# Patient Record
Sex: Male | Born: 1990 | Race: Black or African American | Hispanic: No | Smoking: Never smoker
Health system: Southern US, Community
[De-identification: ages and names within clinical notes are randomized; demographics above are authoritative.]

---

## 2013-06-30 ENCOUNTER — Inpatient Hospital Stay (HOSPITAL_COMMUNITY)
Admission: EM | Admit: 2013-06-30 | Discharge: 2013-07-03 | DRG: 605 | Disposition: A | Discharge status: Other Acute Inpatient Hospital | Payer: Federal, State, Local not specified - Other | Attending: General Surgery | Admitting: General Surgery

## 2013-06-30 ENCOUNTER — Emergency Department (HOSPITAL_COMMUNITY): Payer: Self-pay

## 2013-06-30 ENCOUNTER — Encounter (HOSPITAL_COMMUNITY): Payer: Self-pay | Admitting: Emergency Medicine

## 2013-06-30 DIAGNOSIS — F322 Major depressive disorder, single episode, severe without psychotic features: Secondary | ICD-10-CM | POA: Diagnosis present

## 2013-06-30 DIAGNOSIS — R45851 Suicidal ideations: Secondary | ICD-10-CM

## 2013-06-30 DIAGNOSIS — X789XXA Intentional self-harm by unspecified sharp object, initial encounter: Secondary | ICD-10-CM | POA: Diagnosis present

## 2013-06-30 DIAGNOSIS — S31109A Unspecified open wound of abdominal wall, unspecified quadrant without penetration into peritoneal cavity, initial encounter: Principal | ICD-10-CM | POA: Diagnosis present

## 2013-06-30 DIAGNOSIS — S31119A Laceration without foreign body of abdominal wall, unspecified quadrant without penetration into peritoneal cavity, initial encounter: Secondary | ICD-10-CM

## 2013-06-30 DIAGNOSIS — F411 Generalized anxiety disorder: Secondary | ICD-10-CM | POA: Diagnosis present

## 2013-06-30 DIAGNOSIS — Y92009 Unspecified place in unspecified non-institutional (private) residence as the place of occurrence of the external cause: Secondary | ICD-10-CM

## 2013-06-30 DIAGNOSIS — F332 Major depressive disorder, recurrent severe without psychotic features: Secondary | ICD-10-CM | POA: Diagnosis present

## 2013-06-30 LAB — CBC
HCT: 41 % (ref 39.0–52.0)
Hemoglobin: 14.4 g/dL (ref 13.0–17.0)
MCH: 31.9 pg (ref 26.0–34.0)
MCHC: 35.1 g/dL (ref 30.0–36.0)
MCV: 90.9 fL (ref 78.0–100.0)
Platelets: 194 10*3/uL (ref 150–400)
RBC: 4.51 MIL/uL (ref 4.22–5.81)
RDW: 13 % (ref 11.5–15.5)
WBC: 5.3 10*3/uL (ref 4.0–10.5)

## 2013-06-30 LAB — COMPREHENSIVE METABOLIC PANEL
ALK PHOS: 63 U/L (ref 39–117)
ALT: 9 U/L (ref 0–53)
AST: 19 U/L (ref 0–37)
Albumin: 4.5 g/dL (ref 3.5–5.2)
BUN: 12 mg/dL (ref 6–23)
CO2: 26 mEq/L (ref 19–32)
Calcium: 9.8 mg/dL (ref 8.4–10.5)
Chloride: 104 mEq/L (ref 96–112)
Creatinine, Ser: 1.13 mg/dL (ref 0.50–1.35)
GFR calc Af Amer: >90 mL/min (ref >90)
GFR calc non Af Amer: >90 mL/min (ref >90)
Glucose, Bld: 108 mg/dL — ABNORMAL HIGH (ref 70–99)
Potassium: 3.9 mEq/L (ref 3.7–5.3)
Sodium: 144 mEq/L (ref 137–147)
TOTAL PROTEIN: 8 g/dL (ref 6.0–8.3)
Total Bilirubin: 0.4 mg/dL (ref 0.3–1.2)

## 2013-06-30 LAB — RAPID URINE DRUG SCREEN, HOSP PERFORMED
Amphetamines: NOT DETECTED
BARBITURATES: NOT DETECTED
Benzodiazepines: NOT DETECTED
Cocaine: NOT DETECTED
Opiates: NOT DETECTED
Tetrahydrocannabinol: NOT DETECTED

## 2013-06-30 LAB — I-STAT CHEM 8, ED
BUN: 13 mg/dL (ref 6–23)
CHLORIDE: 104 meq/L (ref 96–112)
Calcium, Ion: 1.24 mmol/L — ABNORMAL HIGH (ref 1.12–1.23)
Creatinine, Ser: 1.2 mg/dL (ref 0.50–1.35)
Glucose, Bld: 105 mg/dL — ABNORMAL HIGH (ref 70–99)
HEMATOCRIT: 57 % — AB (ref 39.0–52.0)
Hemoglobin: 19.4 g/dL — ABNORMAL HIGH (ref 13.0–17.0)
Potassium: 3.8 mEq/L (ref 3.7–5.3)
Sodium: 143 mEq/L (ref 137–147)
TCO2: 26 mmol/L (ref 0–100)

## 2013-06-30 LAB — TYPE AND SCREEN
ABO/RH(D): O POS
ANTIBODY SCREEN: NEGATIVE

## 2013-06-30 LAB — ACETAMINOPHEN LEVEL: Acetaminophen (Tylenol), Serum: <15 ug/mL (ref 10–30)

## 2013-06-30 LAB — ETHANOL

## 2013-06-30 LAB — SALICYLATE LEVEL: Salicylate Lvl: <2 mg/dL — ABNORMAL LOW (ref 2.8–20.0)

## 2013-06-30 LAB — ABO/RH: ABO/RH(D): O POS

## 2013-06-30 MED ORDER — IOHEXOL 300 MG/ML  SOLN
100.0000 mL | Freq: Once | INTRAMUSCULAR | Status: AC | PRN
  Administered 2013-06-30: 100 mL via INTRAVENOUS

## 2013-06-30 MED ORDER — SODIUM CHLORIDE 0.9 % IV SOLN
INTRAVENOUS | Status: DC
  Administered 2013-06-30: 21:00:00 via INTRAVENOUS

## 2013-06-30 NOTE — ED Notes (Signed)
MD at bedside. 

## 2013-06-30 NOTE — ED Notes (Signed)
Pt
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
## 2013-06-30 NOTE — ED Notes (Signed)
Bed: RESB Expected date:  Expected time:  Means of arrival:  Comments: EMS/22 yo male SI

## 2013-06-30 NOTE — ED Notes (Signed)
Pt belongings: grey sweat pants, black wallet (no cash), and black and grey sneakers

## 2013-07-01 DIAGNOSIS — X789XXA Intentional self-harm by unspecified sharp object, initial encounter: Secondary | ICD-10-CM

## 2013-07-01 DIAGNOSIS — F332 Major depressive disorder, recurrent severe without psychotic features: Secondary | ICD-10-CM | POA: Diagnosis present

## 2013-07-01 DIAGNOSIS — F322 Major depressive disorder, single episode, severe without psychotic features: Secondary | ICD-10-CM

## 2013-07-01 DIAGNOSIS — S31109A Unspecified open wound of abdominal wall, unspecified quadrant without penetration into peritoneal cavity, initial encounter: Principal | ICD-10-CM

## 2013-07-01 LAB — CBC
HEMATOCRIT: 39.4 % (ref 39.0–52.0)
Hemoglobin: 13.7 g/dL (ref 13.0–17.0)
MCH: 31.7 pg (ref 26.0–34.0)
MCHC: 34.8 g/dL (ref 30.0–36.0)
MCV: 91.2 fL (ref 78.0–100.0)
Platelets: 185 10*3/uL (ref 150–400)
RBC: 4.32 MIL/uL (ref 4.22–5.81)
RDW: 13.2 % (ref 11.5–15.5)
WBC: 6.1 10*3/uL (ref 4.0–10.5)

## 2013-07-01 LAB — BASIC METABOLIC PANEL
BUN: 9 mg/dL (ref 6–23)
CALCIUM: 9 mg/dL (ref 8.4–10.5)
CO2: 23 mEq/L (ref 19–32)
Chloride: 106 mEq/L (ref 96–112)
Creatinine, Ser: 0.92 mg/dL (ref 0.50–1.35)
Glucose, Bld: 100 mg/dL — ABNORMAL HIGH (ref 70–99)
POTASSIUM: 3.3 meq/L — AB (ref 3.7–5.3)
Sodium: 142 mEq/L (ref 137–147)

## 2013-07-01 LAB — MRSA PCR SCREENING: MRSA by PCR: NEGATIVE

## 2013-07-01 MED ORDER — NAPROXEN 500 MG PO TABS
500.0000 mg | ORAL_TABLET | Freq: Two times a day (BID) | ORAL | Status: DC
  Administered 2013-07-01 – 2013-07-03 (×4): 500 mg via ORAL
  Filled 2013-07-01 (×6): qty 1

## 2013-07-01 MED ORDER — ONDANSETRON HCL 4 MG PO TABS
4.0000 mg | ORAL_TABLET | Freq: Four times a day (QID) | ORAL | Status: DC | PRN
Start: 2013-07-01 — End: 2013-07-03

## 2013-07-01 MED ORDER — PANTOPRAZOLE SODIUM 40 MG IV SOLR
40.0000 mg | Freq: Every day | INTRAVENOUS | Status: DC
  Filled 2013-07-01: qty 40

## 2013-07-01 MED ORDER — MORPHINE SULFATE 2 MG/ML IJ SOLN: 2.0000 mg | INTRAMUSCULAR | Status: DC | PRN

## 2013-07-01 MED ORDER — ACETAMINOPHEN 325 MG PO TABS
650.0000 mg | ORAL_TABLET | ORAL | Status: DC | PRN
  Filled 2013-07-01: qty 2

## 2013-07-01 MED ORDER — PANTOPRAZOLE SODIUM 40 MG PO TBEC: 40.0000 mg | DELAYED_RELEASE_TABLET | Freq: Every day | ORAL | Status: DC

## 2013-07-01 MED ORDER — ONDANSETRON HCL 4 MG/2ML IJ SOLN: 4.0000 mg | Freq: Four times a day (QID) | INTRAMUSCULAR | Status: DC | PRN

## 2013-07-01 MED ORDER — ENOXAPARIN SODIUM 40 MG/0.4ML ~~LOC~~ SOLN
40.0000 mg | SUBCUTANEOUS | Status: DC
  Administered 2013-07-01: 40 mg via SUBCUTANEOUS
  Filled 2013-07-01 (×4): qty 0.4

## 2013-07-01 MED ORDER — KCL IN DEXTROSE-NACL 20-5-0.9 MEQ/L-%-% IV SOLN
INTRAVENOUS | Status: DC
  Administered 2013-07-01: 01:00:00 via INTRAVENOUS
  Filled 2013-07-01 (×3): qty 1000

## 2013-07-01 MED ORDER — BACITRACIN ZINC 500 UNIT/GM EX OINT
TOPICAL_OINTMENT | Freq: Two times a day (BID) | CUTANEOUS | Status: DC
  Administered 2013-07-01 – 2013-07-02 (×3): via TOPICAL
  Administered 2013-07-02: 1 via TOPICAL
  Administered 2013-07-03: 11:00:00 via TOPICAL
  Filled 2013-07-01: qty 15
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
## 2013-07-01 NOTE — Progress Notes (Signed)
Report called to Alona BeneJoyce, receiving RN on  6 north. VSS. Transferred to [id number] via wheelchair with personal belongings. Suicide sitter transferred with patient.  [facility] Lailana Shira,RN

## 2013-07-01 NOTE — Progress Notes (Signed)
UR completed.  Zyria Fiscus, RN BSN MHA CCM Trauma/Neuro ICU Case Manager [phone]  

## 2013-07-01 NOTE — Clinical Social Work Psych Note (Signed)
Psych consult has been confirmed.  Psych CSW now following.  Vickii PennaGina Taytum Wheller, LCSWA [phone]  Clinical Social Work

## 2013-07-01 NOTE — Progress Notes (Signed)
Patient doing well.  This patient has been seen and I agree with the findings and treatment plan.  Marta LamasJames O. Gae BonWyatt, III, MD, FACS [phone] (pager) [phone] (direct pager) Trauma Surgeon
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
## 2013-07-02 NOTE — Progress Notes (Signed)
Medically stable [facility]. Patient examined and I agree with the assessment and plan  Christopher GelinasBurke [name], MD, MPH, FACS Trauma: [phone] General Surgery: [phone]  07/02/2013 1:01 PM
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
## 2013-07-02 NOTE — Clinical Social Work Psych Note (Signed)
[facility]  Vickii PennaGina Aureliano Oshields, LCSWA [phone]  Clinical Social Work

## 2013-07-03 ENCOUNTER — Encounter (HOSPITAL_COMMUNITY): Payer: Self-pay | Admitting: *Deleted

## 2013-07-03 ENCOUNTER — Inpatient Hospital Stay (HOSPITAL_COMMUNITY)
Admission: AD | Admit: 2013-07-03 | Discharge: 2013-07-08 | DRG: 885 | Disposition: A | Discharge status: Home / Self Care | Payer: Federal, State, Local not specified - Other | Source: Intra-hospital | Attending: Psychiatry | Admitting: Psychiatry

## 2013-07-03 DIAGNOSIS — R45851 Suicidal ideations: Secondary | ICD-10-CM

## 2013-07-03 DIAGNOSIS — F332 Major depressive disorder, recurrent severe without psychotic features: Principal | ICD-10-CM

## 2013-07-03 DIAGNOSIS — F411 Generalized anxiety disorder: Secondary | ICD-10-CM | POA: Diagnosis present

## 2013-07-03 DIAGNOSIS — Z634 Disappearance and death of family member: Secondary | ICD-10-CM

## 2013-07-03 DIAGNOSIS — S31119A Laceration without foreign body of abdominal wall, unspecified quadrant without penetration into peritoneal cavity, initial encounter: Secondary | ICD-10-CM | POA: Diagnosis present

## 2013-07-03 DIAGNOSIS — G4700 Insomnia, unspecified: Secondary | ICD-10-CM | POA: Diagnosis present

## 2013-07-03 DIAGNOSIS — E876 Hypokalemia: Secondary | ICD-10-CM | POA: Diagnosis present

## 2013-07-03 DIAGNOSIS — F39 Unspecified mood [affective] disorder: Secondary | ICD-10-CM | POA: Diagnosis present

## 2013-07-03 DIAGNOSIS — F4321 Adjustment disorder with depressed mood: Secondary | ICD-10-CM

## 2013-07-03 MED ORDER — TRAZODONE HCL 50 MG PO TABS
50.0000 mg | ORAL_TABLET | Freq: Every evening | ORAL | Status: DC | PRN
  Administered 2013-07-03 – 2013-07-07 (×6): 50 mg via ORAL
  Filled 2013-07-03 (×14): qty 1

## 2013-07-03 MED ORDER — ACETAMINOPHEN 325 MG PO TABS: 650.0000 mg | ORAL_TABLET | Freq: Four times a day (QID) | ORAL | Status: DC | PRN

## 2013-07-03 MED ORDER — MAGNESIUM HYDROXIDE 400 MG/5ML PO SUSP: 30.0000 mL | Freq: Every day | ORAL | Status: DC | PRN

## 2013-07-03 MED ORDER — ALUM & MAG HYDROXIDE-SIMETH 200-200-20 MG/5ML PO SUSP: 30.0000 mL | ORAL | Status: DC | PRN

## 2013-07-03 MED ORDER — HYDROXYZINE HCL 25 MG PO TABS: 25.0000 mg | ORAL_TABLET | Freq: Four times a day (QID) | ORAL | Status: DC | PRN

## 2013-07-03 MED ORDER — POTASSIUM CHLORIDE CRYS ER 20 MEQ PO TBCR
20.0000 meq | EXTENDED_RELEASE_TABLET | Freq: Two times a day (BID) | ORAL | Status: AC
  Administered 2013-07-03 – 2013-07-05 (×4): 20 meq via ORAL
  Filled 2013-07-03 (×5): qty 1
  Filled 2013-07-03: qty 2

## 2013-07-03 NOTE — Discharge Instructions (Signed)
Wash wounds daily in shower with soap and water. Do not soak. Apply antibiotic ointment (e.g. Neosporin) twice daily and as needed to keep moist. Cover with dry dressing.  

## 2013-07-03 NOTE — Progress Notes (Signed)
[facility] Group Notes:  (Nursing/MHT/Case Management/Adjunct)  Date:  07/03/2013  Time:  8:00 p.m.   Type of Therapy:  Psychoeducational Skills  Participation Level:  None  Participation Quality:  Resistant  Affect:  Depressed  Cognitive:  Lacking  Insight:  None  Engagement in Group:  None  Modes of Intervention:  Education  Summary of Progress/Problems: The patient attended group this evening, but refused to share.   Hazle CocaGOODMAN, Loula Marcella S 07/03/2013, 10:39 PM

## 2013-07-03 NOTE — Progress Notes (Signed)
Bed available at [facility] today per [facility]. CSW met with patient to discuss and he agreed to sign voluntary paperwork. His girlfriend, who was present per his permission, supported his going over to [facility].  [facility] [employer] notified and will come and pick patient up.  Voluntary consent form sent to [facility] for record.  Patient's nurse Blanch Media notified and will complete d/c.  Patient was noted to be calm and cooperative- he has been a resident of [facility] in the past.  CSW provided support and encouragement; answered questions and concerns posed by patient and his girlfriend. CSW signing off.  Lorie Phenix. [place], [place]
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
## 2013-07-03 NOTE — Progress Notes (Signed)
Patient examined and I agree with the assessment and plan  Violeta GelinasBurke Naysha Sholl, MD, MPH, FACS Trauma: [phone] General Surgery: [phone]  07/03/2013 10:50 AM

## 2013-07-03 NOTE — Progress Notes (Signed)
[facility]

## 2013-07-04 DIAGNOSIS — F329 Major depressive disorder, single episode, unspecified: Secondary | ICD-10-CM

## 2013-07-04 DIAGNOSIS — Z634 Disappearance and death of family member: Secondary | ICD-10-CM

## 2013-07-04 DIAGNOSIS — F4321 Adjustment disorder with depressed mood: Secondary | ICD-10-CM | POA: Diagnosis present

## 2013-07-04 NOTE — [facility] Suicide Risk Assessment (Signed)
[facility] INPATIENT:  Family/Significant Other Suicide Prevention Education  Suicide Prevention Education:  Family/Significant Other Refusal to Support Patient after Discharge:  Suicide Prevention Education Not Provided:  Patient has identified home of family/significant other as the place the patient will be residing after discharge.  With written consent of the patient, two attempts were made to provide Suicide Prevention Education to [name], [phone];  This person indicates he/she will not be responsible for the patient after discharge. Ms. Christopher [name] advised due to her history of suicide attempts, she dos not want to receive the informtion.  Christopher [name] 07/04/2013,4:11 PM

## 2013-07-04 NOTE — Progress Notes (Signed)
The focus of this group is to educate the patient on the purpose and policies of crisis stabilization and provide a format to answer questions about their admission.  The group details unit policies and expectations of patients while admitted.  Patient attended 0900 nurse education orientation group this morning.  Patient actively participated, appropriate affect, alert, appropriate insight and engagement.  Today patient will work on 3 goals for discharge.  

## 2013-07-04 NOTE — Progress Notes (Signed)
Adult Psychoeducational Group Note  Date:  07/04/2013 Time:  10:18 AM  Group Topic/Focus:  Self Esteem Action Plan:   The focus of this group is to help patients create a plan to continue to build self-esteem after discharge.  Participation Level:  Did Not Attend   Elijio MilesMercer, Lucian Baswell N 07/04/2013, 10:18 AM
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
## 2013-07-04 NOTE — Progress Notes (Signed)
D:  Patient's self inventory sheet, patient sleeps well, improving appetite, normal energy level, good attention span.  Rated depression and anxiety 5, hopeless 1.  Denied withdrawals.  Denied SI.  Denied physical problems.  Plans to see counselor after discharge.  Does have discharge plans.  Not concerned about medications. A:  Medications administered per MD orders.  Emotional support and encouragement given patient. R:  Denied SI and HI.  Denied A/V hallucinations.  Will continue to monitor patient for safety with 15 minute checks.  Safety maintained.

## 2013-07-04 NOTE — Progress Notes (Signed)
Patient ID: Christopher [name], male   DOB: 10/17/1990, 23 y.o.   MRN: [id number] D: Pt. Visible on the unit, reports depression as "2" of 10, notes that he has considered conversation between himself and physician and he does want to be treated with an antidepressant. A: Writer introduced self to client and encourages him to speak to physician in the morning about medications. Staff encouraged group. Staff will monitor q6415min for safety. R: Pt. Is safe on the unit and attended karaoke.

## 2013-07-04 NOTE — BHH Counselor (Signed)
Adult Comprehensive Assessment  Patient ID: Lamone Ferrelli, male   DOB: [birth year], 23 y.o.   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 Family Relationships: Stress in relationship with fiance since death of thier daughter from [employer] / Lack of resources (include bankruptcy): Needs more money but makes it okay Housing / Lack of housing: None Physical health (include injuries & life threatening diseases): None Social relationships: None Substance abuse: None  Bereavement / Loss: One month old daughter died of SIDS 03-04-2013  Living/Environment/Situation:  Living conditions (as described by patient or guardian): Okay How long has patient lived in current situation?: Twoo months What is atmosphere in current home: Comfortable;Supportive  Family History:  Marital status: Long term relationship Long term relationship, how long?: One year  What types of issues is patient dealing with in the relationship?: Death of infant daughter and relocating to [place]  Childhood History:  By whom was/is the patient raised?: Grandparents Additional childhood history information: Patient reports his mother diied when he was [age] years old, Grandmother at age 43 and a sister died when he was [age]. Description of patient's relationship with caregiver when they were a child: Good relationship Patient's description of current relationship with people who raised him/her: Good relationship with Grandmother who he refers to as his mother Does patient have siblings?: Yes Number of Siblings: 1 Description of patient's current relationship with siblings: Very close - patient reports having other sibling by his father but not having a relationship with them Did patient suffer any  verbal/emotional/physical/sexual abuse as a child?: No Did patient suffer from severe childhood neglect?: No Has patient ever been sexually abused/assaulted/raped as an adolescent or adult?: No Was the patient ever a victim of a crime or a disaster?: No Witnessed domestic violence?: No Has patient been effected by domestic violence as an adult?: No  Education:  Highest grade of school patient has completed: Producer, television/film/video Currently a student?: No Learning disability?: No  Employment/Work Situation:   Employment situation: Employed Where is patient currently employed?: Patient reports bieng employed by [employer] and [employer] How long has patient been employed?: [employer] for four months and [employer] for one month Patient's job has been impacted by current illness: No What is the longest time patient has a held a job?: Three and a half Years Where was the patient employed at that time?: [employer] Has patient ever been in the [employer]?: Yes (Describe in comment) Garment/textile technologist) Has patient ever served in Buyer, retail?: No  Financial Resources:   Surveyor, quantity resources: Income from employment  Alcohol/Substance Abuse:   What has been your use of drugs/alcohol within the last 12 months?: Patient denies If attempted suicide, did drugs/alcohol play a role in this?: No Alcohol/Substance Abuse Treatment Hx: Denies past history Has alcohol/substance abuse ever caused legal problems?: No  Social Support System:   Forensic psychologist System: None Describe Community Support System: N/A Type of faith/religion: Christian How does patient's faith help to cope with current illness?: Patient reports he has questioned God since the death of his daughter  Leisure/Recreation:   Leisure and Hobbies: Counselling psychologist, walking and traveling  Strengths/Needs:   What things does the patient do well?: Paitent reports he is a very giving person In what areas does patient struggle / problems for patient: Anger  Discharge Plan:  Does patient have access to transportation?: Yes Will patient be returning to same living situation after discharge?: Yes Currently receiving community mental health services: No If no, would patient like referral for services when discharged?:  ([facility]) Does patient have financial barriers related to discharge medications?: Yes Patient description of barriers related to discharge medications: Patient does not have insurance.  Summary/Recommendations:  Braulio Conteyler Savoia is a 23 year old African American male admitted with Major Depression Disorder.  He will .benefit from crisis stabilization, evaluation for medication, psycho-education groups for coping skills development, group therapy and case management for discharge planning.=    Aleks Nawrot Hairston. 07/04/2013

## 2013-07-04 NOTE — B[facility] Group Notes (Signed)
B[facility] LCSW Group Therapy  Living A Balanced Life  1:15 - 2: 30          07/04/2013    Type of Therapy:  Group Therapy  Participation Level:  Appropriate  Participation Quality:  Appropriate  Affect:  Appropriate  Cognitive:  Attentive Appropriate  Insight: Developing/Improving  Engagement in Therapy:  Developing/Improving  Modes of Intervention:  Discussion Exploration Problem-Solving Supportive   Summary of Progress/Problems: Topic for group was Living a Balanced Life.  Patient was able to show how life has become unbalanced.  Patient shared since moving to [place] a few months ago, he spends most of his time working.  He shared he had a child to die of SIDS October 2014 and he is having difficulty dealing with that loss.   Patient able to  Identify appropriate coping skills.   Wynn BankerHodnett, Krystn Dermody Hairston 07/04/2013

## 2013-07-04 NOTE — [facility] Suicide Risk Assessment (Signed)
   Nursing information obtained from:  Patient Demographic factors:  Male;Adolescent or young adult Current Mental Status:    Loss Factors:  Loss of significant relationship Historical Factors:    Risk Reduction Factors:  Sense of responsibility to family;Religious beliefs about death;Employed;Positive social support Total Time spent with patient: 45 minutes  CLINICAL FACTORS:   Severe Anxiety and/or Agitation Depression:   Aggression Hopelessness Impulsivity Insomnia Recent sense of peace/wellbeing Severe Unstable or Poor Therapeutic Relationship Medical Diagnoses and Treatments/Surgeries  Psychiatric Specialty Exam: Physical Exam  Review of Systems  Psychiatric/Behavioral: Positive for depression and suicidal ideas. The patient is nervous/anxious and has insomnia.   All other systems reviewed and are negative.    Blood pressure 117/68, pulse 94, temperature 97.8 F (36.6 C), temperature source Oral, resp. rate 16, height 5\' 7"  (1.702 m), weight 79.833 kg (176 lb).Body mass index is 27.56 kg/(m^2).  General Appearance: Guarded  Eye Contact::  Fair  Speech:  Clear and Coherent and Slow  Volume:  Decreased  Mood:  Anxious, Depressed, Hopeless, Irritable and Worthless  Affect:  Depressed and Flat  Thought Process:  Goal Directed and Intact  Orientation:  Full (Time, Place, and Person)  Thought Content:  Rumination  Suicidal Thoughts:  Yes.  with intent/plan  Homicidal Thoughts:  No  Memory:  Immediate;   Fair  Judgement:  Impaired  Insight:  Lacking  Psychomotor Activity:  Psychomotor Retardation  Concentration:  Fair  Recall:  Fair  Fund of Knowledge:Good  Language: Good  Akathisia:  NA  Handed:  Right  AIMS (if indicated):     Assets:  Communication Skills Desire for Improvement Financial Resources/Insurance Housing Intimacy Leisure Time Physical Health Resilience Social Support Talents/Skills Transportation  Sleep:  Number of Hours: 6    Musculoskeletal: Strength & Muscle Tone: within normal limits Gait & Station: normal Patient leans: N/A  COGNITIVE FEATURES THAT CONTRIBUTE TO RISK:  Closed-mindedness Loss of executive function Polarized thinking Thought constriction (tunnel vision)    SUICIDE RISK:   Moderate:  Frequent suicidal ideation with limited intensity, and duration, some specificity in terms of plans, no associated intent, good self-control, limited dysphoria/symptomatology, some risk factors present, and identifiable protective factors, including available and accessible social support.  PLAN OF CARE: Admit for crisis stabilization, safety monitoring her medication management and, and the management, grief counseling for major depressive disorder with the self-injurious behavior.  I certify that inpatient services furnished can reasonably be expected to improve the patient's condition.  Ndidi Nesby,[name] 07/04/2013, 11:26 AM

## 2013-07-04 NOTE — Progress Notes (Signed)
PATIENT SIGNED 72 DISCHARGE REQUEST DATED 07/03/2013 AT 1945.

## 2013-07-05 DIAGNOSIS — F332 Major depressive disorder, recurrent severe without psychotic features: Principal | ICD-10-CM

## 2013-07-05 DIAGNOSIS — R45851 Suicidal ideations: Secondary | ICD-10-CM

## 2013-07-05 MED ORDER — POTASSIUM CHLORIDE CRYS ER 10 MEQ PO TBCR
10.0000 meq | EXTENDED_RELEASE_TABLET | Freq: Every day | ORAL | Status: DC
  Administered 2013-07-05 – 2013-07-08 (×4): 10 meq via ORAL
  Filled 2013-07-05 (×6): qty 1

## 2013-07-05 MED ORDER — BUPROPION HCL ER (XL) 150 MG PO TB24
150.0000 mg | ORAL_TABLET | Freq: Every day | ORAL | Status: DC
  Administered 2013-07-06 – 2013-07-08 (×3): 150 mg via ORAL
  Filled 2013-07-05 (×6): qty 1
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
## 2013-07-05 NOTE — Progress Notes (Signed)
[name] is seen OOB UAL on the 500 hall today..he tolerates this well. HE makes good eye contact. HE smiles to staff and his peers and is seen wtching TV in the dayroom.   A HE takes his scheduled meds as ordered. He denies SI within the past 24 hrs and he rates his depression and hoplessness " 1/1".    R Safety is in place and poc moves forward.

## 2013-07-05 NOTE — Progress Notes (Signed)
[facility] Group Notes:  (Nursing/MHT/Case Management/Adjunct)  Date:  07/05/2013  Time:  11:55 PM  Type of Therapy:  Group Therapy  Participation Level:  Active  Participation Quality:  Appropriate  Affect:  Appropriate  Cognitive:  Alert and Appropriate  Insight:  Appropriate  Engagement in Group:  Engaged  Modes of Intervention:  Socialization and Support  Summary of Progress/Problems: Pt. Was appropriate and participated in group discussion on relapse prevention.  Pt. Stated he plan to prevent relapse involved being around friends he had a positive relationship with and utilize his support system.  Sondra ComeWilson, Jann Milkovich J 07/05/2013, 11:55 PM

## 2013-07-05 NOTE — Progress Notes (Signed)
[facility] MD Progress Note  07/05/2013 4:05 PM Christopher [name]  MRN:  [id number] Subjective:  Patient endorses depression related to increase stress and his recent loss of his 89 week old daughter.  His girlfriend has been despondent but his mother is a good support system for him.  Joselyn Glassman discussed outpatient treatment for depression and is open to pharmacological treatment of symptoms-Wellbutrin started after discussing the purpose and side effects of the medication.  Verbalizes understanding; states appetite has improved and sleep is "Ok"; noted to taking a late afternoon nap.  Finding therapeutic groups helpful. Diagnosis:   DSM5:  Depressive Disorders:  Major Depressive Disorder - Severe (296.23) Total Time spent with patient: 20 minutes  Axis I: Major Depression, Recurrent severe Axis II: Deferred Axis III: History reviewed. No pertinent past medical history. Axis IV: other psychosocial or environmental problems, problems related to social environment and problems with primary support group Axis V: 41-50 serious symptoms  ADL's:  Intact  Sleep: Fair  Appetite:  Fair  Suicidal Ideation:  Plan:  cut himself Intent:  yes [name]:  none Homicidal Ideation:  Denies  Psychiatric Specialty Exam: Physical Exam  Vitals reviewed. Constitutional: He is oriented to person, place, and time. He appears well-developed and well-nourished.  HENT:  Head: Normocephalic and atraumatic.  Neck: Normal range of motion.  Respiratory: Effort normal.  Musculoskeletal: Normal range of motion.  Neurological: He is alert and oriented to person, place, and time.  Skin: Skin is warm and dry.    Review of Systems  Constitutional: Negative.   HENT: Negative.   Eyes: Negative.   Respiratory: Negative.   Cardiovascular: Negative.   Gastrointestinal: Negative.   Genitourinary: Negative.   Musculoskeletal: Negative.   Skin: Negative.   Neurological: Negative.   Endo/Heme/Allergies: Negative.    Psychiatric/Behavioral: Positive for depression and suicidal ideas.    Blood pressure 130/86, pulse 88, temperature 97.7 F (36.5 C), temperature source Oral, resp. rate 18, height 5\' 7"  (1.702 m), weight 79.833 kg (176 lb).Body mass index is 27.56 kg/(m^2).  General Appearance: Casual  Eye Contact::  Fair  Speech:  Normal Rate  Volume:  Normal  Mood:  Depressed  Affect:  Congruent  Thought Process:  Coherent; guarded  Orientation:  Full (Time, Place, and Person)  Thought Content:  WDL  Suicidal Thoughts:  Yes.  with intent/plan  Homicidal Thoughts:  No  Memory:  Immediate;   Fair Recent;   Fair Remote;   Fair  Judgement:  Fair  Insight:  Fair  Psychomotor Activity:  Decreased  Concentration:  Fair  Recall:  [employer] of Knowledge:Fair  Language: Good  Akathisia:  No  Handed:  Right  AIMS (if indicated):     Assets:  Physical Health Resilience Social Support  Sleep:  Number of Hours: 6.5   Musculoskeletal: Strength & Muscle Tone: within normal limits Gait & Station: normal Patient leans: N/A  Current Medications: Current Facility-Administered Medications  Medication Dose Route Frequency Provider Last Rate Last Dose  . acetaminophen (TYLENOL) tablet 650 mg  650 mg Oral Q6H PRN Christopher [name], Christopher [name]      . alum & mag hydroxide-simeth (MAALOX/MYLANTA) 200-200-20 MG/5ML suspension 30 mL  30 mL Oral Q4H PRN Christopher [name], Christopher [name]      . buPROPion (WELLBUTRIN XL) 24 hr tablet 150 mg  150 mg Oral Daily Christopher [name]      . hydrOXYzine (ATARAX/VISTARIL) tablet 25 mg  25 mg Oral Q6H PRN Christopher [name], Christopher [name]      .  magnesium hydroxide (MILK OF MAGNESIA) suspension 30 mL  30 mL Oral Daily PRN Christopher [name], Christopher [name]      . traZODone (DESYREL) tablet 50 mg  50 mg Oral QHS,MR X 1 Christopher [name], Christopher [name]   50 mg at 07/04/13 2235    Lab Results: No results found for this or any previous visit (from the past 48 hour(s)).  Physical Findings: AIMS: Facial and Oral  Movements Muscles of Facial Expression: None, normal Lips and Perioral Area: None, normal Jaw: None, normal Tongue: None, normal,Extremity Movements Upper (arms, wrists, hands, fingers): None, normal Lower (legs, knees, ankles, toes): None, normal, Trunk Movements Neck, shoulders, hips: None, normal, Overall Severity Severity of abnormal movements (highest score from questions above): None, normal Incapacitation due to abnormal movements: None, normal Patient's awareness of abnormal movements (rate only patient's report): No Awareness, Dental Status Current problems with teeth and/or dentures?: No Does patient usually wear dentures?: No  CIWA:  CIWA-Ar Total: 1 COWS:  COWS Total Score: 2  Treatment Plan Summary: Daily contact with patient to assess and evaluate symptoms and progress in treatment Medication management  Plan:  Review of chart, vital signs, medications, and notes. 1-Individual and group therapy 2-Medication management for depression and anxiety:  Medications reviewed with the patient and medications started: Wellbutrin 150 mg depression started Potassium 10 mEq daily for hypokalemia Trazodone 50 mg at bedtime for sleep 3-Coping skills for depression, anxiety, and grief 4-Continue crisis stabilization and management 5-Address health issues--monitoring vital signs, stable 6-Treatment plan in progress to prevent relapse of depression, grief, and anxiety  Medical Decision Making Problem Points:  Established problem, stable/improving (1) and Review of psycho-social stressors (1) Data Points:  Review of new medications or change in dosage (2)  I certify that inpatient services furnished can reasonably be expected to improve the patient's condition.   Christopher [name], Christopher [name], Christopher [name] 07/05/2013, 4:05 PM  Reviewed the information documented and agree with the treatment plan.  Christopher [name],Christopher R. 07/05/2013 6:35 PM

## 2013-07-05 NOTE — [facility] Group Notes (Signed)
[facility] LCSW Group Therapy  Feelings Around Relapse 1:15 -2:30        07/05/2013 2:48 PM   Type of Therapy:  Group Therapy  Participation Level:  Appropriate  Participation Quality:  Appropriate  Affect:  Appropriate  Cognitive:  Attentive Appropriate  Insight:  Developing/Improving  Engagement in Therapy: Developing/Improving  Modes of Intervention:  Discussion Exploration Problem-Solving Supportive  Summary of Progress/Problems:  The topic for today was feelings around relapse.    Patient processed feelings toward relapse and was able to relate to peers. He advised he would be isolating if he relapsed.  Patient identified coping skills that can be used to prevent a relapse.   Christopher [name], Christopher [name] 07/05/2013 2:48 PM

## 2013-07-05 NOTE — [facility] Group Notes (Signed)
[facility]   07/05/2013 9:27 AM    Participation Quality:  Appropraite  Mood/Affect:  Appropriate  Depression Rating:  1  Anxiety Rating:  1  Thoughts of Suicide:  No  Will you contract for safety?   NA  Current AVH:  No  Plan for Discharge/Comments:  Patient attended discharge planning group and actively participated in group.  Patient will return to his home and [facility]  CSW provided all participants with daily workbook.   Transportation Means: Patient has transportation.   Supports:  Patient has a support system.   Celines Femia, Joesph JulyQuylle Hairston

## 2013-07-06 NOTE — Progress Notes (Signed)
[facility]  07/06/2013 1:22 PM Christopher [name]  MRN:  [id number] Subjective:  Christopher Glassmanyler today is calm, friendly and open to tell me his story.  He recognizes his depression and recent suicidal attempt was in response to suppressed grief of his one 29month old daughter who died of SIDS in Fall of 2014 He tells me this hospital stay is helping.  He is participating in groups.  He rates his depression 1/10 and anxiety 1/10. He denies SI/HI  Diagnosis:   DSM5:  Depressive Disorders:  Major Depressive Disorder - Severe (296.23) Total Time spent with patient: 20 minutes  Axis I: Major Depression, Recurrent severe Axis II: Deferred Axis III: History reviewed. No pertinent past medical history. Axis IV: other psychosocial or environmental problems, problems related to social environment and problems with primary support group Axis V: 41-50 serious symptoms  ADL's:  Intact  Sleep: Fair  Appetite:  Fair  Suicidal Ideation:  Plan:  cut himself Intent:  yes Means:  none Homicidal Ideation:  Denies  Psychiatric Specialty Exam: Physical Exam  Vitals reviewed. Constitutional: He is oriented to person, place, and time. He appears well-developed and well-nourished.  HENT:  Head: Normocephalic and atraumatic.  Neck: Normal range of motion.  Respiratory: Effort normal.  Musculoskeletal: Normal range of motion.  Neurological: He is alert and oriented to person, place, and time.  Skin: Skin is warm and dry.    Review of Systems  Constitutional: Negative.   HENT: Negative.   Eyes: Negative.   Respiratory: Negative.   Cardiovascular: Negative.   Gastrointestinal: Negative.   Genitourinary: Negative.   Musculoskeletal: Negative.   Skin: Negative.   Neurological: Negative.   Endo/Heme/Allergies: Negative.   Psychiatric/Behavioral: Positive for depression and suicidal ideas.    Blood pressure 130/74, pulse 75, temperature 97.5 F (36.4 C), temperature source Oral, resp. rate 15,  height 5\' 7"  (1.702 m), weight 79.833 kg (176 lb).Body mass index is 27.56 kg/(m^2).  General Appearance: Casual  Eye Contact::  Good  Speech:  Normal Rate  Volume:  Normal  Mood:  depressed  Affect:  Congruent  Thought Process:  Coherent; guarded  Orientation:  Full (Time, Place, and Person)  Thought Content:  WDL  Suicidal Thoughts:  denies  Homicidal Thoughts:  No  Memory:  Immediate;   Fair Recent;   Fair Remote;   Fair  Judgement:  Fair  Insight:  Fair  Psychomotor Activity:  WNL  Concentration:  Fair  Recall:  [name]  Fund of Knowledge:Fair  Language: Good  Akathisia:  No  Handed:  Right  AIMS (if indicated):     Assets:  Physical Health Resilience Social Support  Sleep:  Number of Hours: 5.25   Musculoskeletal: Strength & Muscle Tone: within normal limits Gait & Station: normal Patient leans: N/A  Current Medications: Current Facility-Administered Medications  Medication Dose Route Frequency Provider Last Rate Last Dose  . acetaminophen (TYLENOL) tablet 650 mg  650 mg Oral Q6H PRN Kerry HoughSpencer E Simon, PA-C      . alum & mag hydroxide-simeth (MAALOX/MYLANTA) 200-200-20 MG/5ML suspension 30 mL  30 mL Oral Q4H PRN Kerry HoughSpencer E Simon, PA-C      . buPROPion (WELLBUTRIN XL) 24 hr tablet 150 mg  150 mg Oral Daily Nanine MeansJamison Lord, NP   150 mg at 07/06/13 0856  . hydrOXYzine (ATARAX/VISTARIL) tablet 25 mg  25 mg Oral Q6H PRN Kerry HoughSpencer E Simon, PA-C      . magnesium hydroxide (MILK OF MAGNESIA) suspension 30 mL  30  mL Oral Daily PRN Kerry Hough, PA-C      . potassium chloride (K-DUR,KLOR-CON) CR tablet 10 mEq  10 mEq Oral Daily Nanine Means, NP   10 mEq at 07/06/13 0856  . traZODone (DESYREL) tablet 50 mg  50 mg Oral QHS,MR X 1 Kerry Hough, PA-C   50 mg at 07/05/13 2342    Lab Results: No results found for this or any previous visit (from the past 48 hour(s)).  Physical Findings: AIMS: Facial and Oral Movements Muscles of Facial Expression: None, normal Lips and Perioral  Area: None, normal Jaw: None, normal Tongue: None, normal,Extremity Movements Upper (arms, wrists, hands, fingers): None, normal Lower (legs, knees, ankles, toes): None, normal, Trunk Movements Neck, shoulders, hips: None, normal, Overall Severity Severity of abnormal movements (highest score from questions above): None, normal Incapacitation due to abnormal movements: None, normal Patient's awareness of abnormal movements (rate only patient's report): No Awareness, Dental Status Current problems with teeth and/or dentures?: No Does patient usually wear dentures?: No  CIWA:  CIWA-Ar Total: 1 COWS:  COWS Total Score: 2  Treatment Plan Summary: Daily contact with patient to assess and evaluate symptoms and progress in treatment Medication management  Plan:  Review of chart, vital signs, medications, and notes. 1-Individual and group therapy 2-Medication management for depression and anxiety:  Medications reviewed with the patient and medications started: Wellbutrin 150 mg depression started Potassium 10 mEq daily for hypokalemia Trazodone 50 mg at bedtime for sleep Reviewed medications, denies SE 3-Coping skills for depression, anxiety, and grief Discussed coping strategies.  He has always loved to write poetry and he is encouraged creative writing as an outlet for his feelings 4-Continue crisis stabilization and management 5-Address health issues--monitoring vital signs, stable 6-Treatment plan in progress to prevent relapse of depression, grief, and anxiety  Medical Decision Making Problem Points:  Established problem, stable/improving (1) and Review of psycho-social stressors (1) Data Points:  Review of new medications or change in dosage (2)  I certify that inpatient services furnished can reasonably be expected to improve the patient's condition.   Lorinda Creed, PMH-NP 07/06/2013, 1:22 PM   Patient seen, evaluated and I agree with notes by Nurse Practitioner. Thedore Mins, MD

## 2013-07-06 NOTE — Progress Notes (Addendum)
[name] is seen OOB UAL on the 500 hall today...tolerated fair. He is sad, quiet and flat. HE takes his morning meds as ordered.   A  He completes his morning self inventory and on it he writes he denies SI, he rates his depression and hopelessness " 1 / 1 " and says his DC plan is to " go out more".    R Safety is in place and poc maintained and therapeutic relationship fostered. 1600 07/06/2013 Addendum Discussed pt's 72 hr request for DC that will expire at 1945 this pm. Per his request, he rescinds 72 hr, signs and it is witnessed by this Clinical research associatewriter. Dr. Mervyn SkeetersA . Made aware and poc moves forward.

## 2013-07-06 NOTE — [facility] Group Notes (Signed)
[facility] Group Notes:  (Clinical Social Work)  07/06/2013   1:15-2:15PM  Summary of Progress/Problems:   The main focus of today's process group was for the patient to identify ways in which they have sabotaged their own mental health wellness/recovery.  Motivational interviewing and a handout were used to explore the benefits and costs of their self-sabotaging behavior as well as the benefits and costs of changing this behavior.  The Stages of Change were explained to the group using a handout, and patients identified where they are with regard to changing self-defeating behaviors.  The patient expressed he self-sabotages with isolation because that means he does not have to be bothered with other people.  It also means he focuses on the negative, but he does not get in trouble like he would if he stayed in a situation and it escalated.  He says that if he is able to change this behavior, however, it would enable him to have better communication with the people in his life, thus have better relationships and deeper friendships.  He did recognize that pursuing this could cost him time, because he would have to spend time with those people in order to have relationships.  Type of Therapy:  Process Group  Participation Level:  Active  Participation Quality:  Attentive and Sharing  Affect:  Appropriate  Cognitive:  Appropriate  Insight:  Developing/Improving  Engagement in Therapy:  Engaged  Modes of Intervention:  Education, Motivational Interviewing   Ambrose MantleMareida Grossman-Orr, LCSW 07/06/2013, 4:00pm

## 2013-07-06 NOTE — Progress Notes (Signed)
[facility] Group Notes:  (Nursing/MHT/Case Management/Adjunct)  Date:  07/06/2013  Time:  11:50 PM  Type of Therapy:  Group Therapy  Participation Level:  Active  Participation Quality:  Appropriate  Affect:  Appropriate  Cognitive:  Appropriate  Insight:  Appropriate  Engagement in Group:  Engaged  Modes of Intervention:  Socialization and Support  Summary of Progress/Problems: Pt, rated his energy level as a 10.  Pt stated he was admitted or depression.  Pt. Stated positive communication was a healthy behavior he attends practise.  Sondra ComeWilson, Lorien Shingler J 07/06/2013, 11:50 PM

## 2013-07-06 NOTE — Progress Notes (Signed)
Psychoeducational Group Note  Date: 07/06/2013 Time:  1015  Group Topic/Focus:  Identifying Needs:   The focus of this group is to help patients identify their personal needs that have been historically problematic and identify healthy behaviors to address their needs.  Participation Level:  Active  Participation Quality:  Appropriate  Affect:  Flat  Cognitive:  Appropriate  Insight:  improving  Engagement in Group:  Engaged  Additional Comments:    Arneshia Ade A  

## 2013-07-06 NOTE — Progress Notes (Signed)
Adult Psychoeducational Group Note  Date:  07/06/2013 Time:  6:56 PM  Group Topic/Focus:  Healthy Communication:   The focus of this group is to discuss communication, barriers to communication, as well as healthy ways to communicate with others.  Participation Level:  Active  Participation Quality:  Appropriate and Attentive  Affect:  Appropriate  Cognitive:  Appropriate  Insight: Appropriate  Engagement in Group:  Engaged  Modes of Intervention:  Activity, Discussion and Socialization    Elijio MilesMercer, Deepika Decatur N 07/06/2013, 6:56 PM

## 2013-07-06 NOTE — Progress Notes (Signed)
Patient ID: Christopher [name], male   DOB: [birth year], 23 y.o.   MRN: [id number] D: Patient stated he is doing well. Pt reports decreased anxiety and depressive symptoms. Pt rated depression and anxiety as 0 on a 0-10 scale. Pt reports seeking grieve counseling after discharge to help with the  loss of his child. Pt denies SI/HI/AVH and pain. Pt attended evening wrap up group and engaged in discussion. Pt denies any needs or concerns. Cooperative with assessment. No acute distressed noted at this time.   A: Met with pt 1:1. Medications administered as prescribed. Writer encouraged pt to discuss feelings. Pt encouraged to come to staff with any questions or concerns.   R: Patient is safe on the unit. He is complaint with medications and denies any adverse reaction. Continue current POC.

## 2013-07-06 NOTE — Progress Notes (Signed)
.  Psychoeducational Group Note    Date: 07/06/2013 Time:  0930  Goal Setting Purpose of Group: To be able to set a goal that is measurable and that can be accomplished in one day Participation Level:  Active  Participation Quality:  Appropriate  Affect:  Appropriate  Cognitive:  Oriented  Insight:  Improving  Engagement in Group:  Engaged  Additional Comments:    Danyell Awbrey A 

## 2013-07-06 NOTE — Progress Notes (Signed)
Patient has been up and active on the unit, attended group this evening and has voiced no complaints. Patient currently denies having pain, -si/hi/a/v hall. Support and encouragement offered, safety maintained on unit, will continue to monitor.  

## 2013-07-07 NOTE — Progress Notes (Signed)
Psychoeducational Group Note  Date:  07/07/2013 Time:  1015  Group Topic/Focus:  Making Healthy Choices:   The focus of this group is to help patients identify negative/unhealthy choices they were using prior to admission and identify positive/healthier coping strategies to replace them upon discharge.  Participation Level:  Active  Participation Quality:  Appropriate  Affect:  Flat  Cognitive:  Oriented  Insight:  Improving  Engagement in Group:  Engaged  Additional Comments:    Nilson Tabora A 07/07/2013 

## 2013-07-07 NOTE — Progress Notes (Signed)
Writer spoke with patient 1:1 and reports having had a good day. He reports that he plans to start attending a grief/loss counseling d/t the death of his daughter. He reports that he feels that the medication is working and will let his doctor know upon discharge if he plans to continue taking it. He voiced no complaints and is pleasant and cheerful. He currently denies si/hi/a/v hallucinations. Safety maintained on unit with 15 min checks.

## 2013-07-07 NOTE — [facility] Group Notes (Signed)
[facility] Group Notes:  (Clinical Social Work)  07/07/2013   3-4PM  Summary of Progress/Problems:  The main focus of today's process group was to identify the patient's current support system and decide on other supports that can be put in place.  The illustration of a 4-legged chair was used to discuss why additional supports are needed.  An emphasis was placed on using counselor, doctor, therapy groups, 12-step groups, and problem-specific support groups to expand supports.   There was also an extensive discussion about what constitutes a healthy support versus an unhealthy support.  The patient expressed comprehension of the concepts presented.  He participated fully in the discussion and was displaying a great deal of humor.  Type of Therapy:  Process Group  Participation Level:  Active  Participation Quality:  Attentive and Sharing  Affect:  Blunted slightly  Cognitive:  Appropriate and Oriented  Insight:  Engaged  Engagement in Therapy:  Engaged  Modes of Intervention:  Education,  Support and [employer] FoodsProcessing  Ieshia Hatcher Grossman-Orr, LCSW 07/07/2013, 4:00pm

## 2013-07-07 NOTE — Progress Notes (Signed)
Adult Psychoeducational Group Note  Date:  07/07/2013 Time:  9:58 PM  Group Topic/Focus:  Wrap-Up Group:   The focus of this group is to help patients review their daily goal of treatment and discuss progress on daily workbooks.  Participation Level:  Active  Participation Quality:  Appropriate  Affect:  Appropriate  Cognitive:  Appropriate  Insight: Appropriate  Engagement in Group:  Engaged  Modes of Intervention:  Discussion  Additional Comments: The patient expressed that he learned in group to keep a positive support group around himself.   Octavio Mannshigpen, Holly Iannaccone Lee 07/07/2013, 9:58 PM

## 2013-07-07 NOTE — [facility] Group Notes (Signed)
Psychoeducational Group Note  Date:  07/07/2013 Time:  0930  Group Topic/Focus:  Making Healthy Choices:   The focus of this group is to help patients identify negative/unhealthy choices they were using prior to admission and identify positive/healthier coping strategies to replace them upon discharge.  Participation Level: active  Participation Quality: appropraite  Affect: appropriate  Cognitive:  oriented  Insight:  Improving  Engagement in Group: engaged Additional Comments:    Jule SerKent, Abigial Newville Gail 07/07/2013, 11:30 AM

## 2013-07-07 NOTE — Progress Notes (Signed)
D) Pt has been attending the program and interacting with his peers appropriately. Denies SI and HI. Rates his depression and hopelessness both at a 1. States that overall he is feeling much better and is looking forward to being discharged. Affect and mood are less intense. Has participated in the groups A) Given support reassurance and praise. Encouraged to work on his issues of anger while here and to work on his packet.  R) Denies SI and HI.

## 2013-07-07 NOTE — Progress Notes (Signed)
[facility] MD Progress Note  07/07/2013 8:14 AM Christopher [name]  MRN:  [id number] Subjective:  Christopher [name]  is calm, friendly and cooperative.  He recognizes his depression and recent suicidal attempt was in response to suppressed grief of his one 88 old daughter who died of SIDS in Fall of 2012-09-11.  I shared with him a writing exercise to explore his daughter Myna Hidalgo and to save her memory on paper; he is looking forward to working on it. He tells me this hospital stay is helping.  He is participating in groups.  He rates his depression 1/10 and anxiety 1/10. He denies SI/HI.  He hopes to dc soon, get back to work.  He plans to keep his f/u appointments  Diagnosis:   DSM5:  Depressive Disorders:  Major Depressive Disorder - Severe (296.23) Total Time spent with patient: 20 minutes  Axis I: Major Depression, Recurrent severe Axis II: Deferred Axis III: History reviewed. No pertinent past medical history. Axis IV: other psychosocial or environmental problems, problems related to social environment and problems with primary support group Axis V: 41-50 serious symptoms  ADL's:  Intact  Sleep: Good  Appetite:  Good  Suicidal Ideation:  -denies Homicidal Ideation:  Denies  Psychiatric Specialty Exam: Physical Exam  Vitals reviewed. Constitutional: He is oriented to person, place, and time. He appears well-developed and well-nourished.  HENT:  Head: Normocephalic and atraumatic.  Neck: Normal range of motion.  Respiratory: Effort normal.  Musculoskeletal: Normal range of motion.  Neurological: He is alert and oriented to person, place, and time.  Skin: Skin is warm and dry.    Review of Systems  Constitutional: Negative.   HENT: Negative.   Eyes: Negative.   Respiratory: Negative.   Cardiovascular: Negative.   Gastrointestinal: Negative.   Genitourinary: Negative.   Musculoskeletal: Negative.   Skin: Negative.   Neurological: Negative.   Endo/Heme/Allergies: Negative.    Psychiatric/Behavioral: Positive for depression and suicidal ideas.    Blood pressure 130/74, pulse 75, temperature 97.5 F (36.4 C), temperature source Oral, resp. rate 15, height 5\' 7"  (1.702 m), weight 79.833 kg (176 lb).Body mass index is 27.56 kg/(m^2).  General Appearance: Casual  Eye Contact::  Good  Speech:  Normal Rate  Volume:  Normal  Mood:  depressed  Affect:  Congruent  Thought Process:  Coherent; guarded  Orientation:  Full (Time, Place, and Person)  Thought Content:  WDL  Suicidal Thoughts:  denies  Homicidal Thoughts:  No  Memory:  Immediate;   Fair Recent;   Fair Remote;   Fair  Judgement:  Fair  Insight:  Fair  Psychomotor Activity:  WNL  Concentration:  Fair  Recall:  [employer] of Knowledge:Fair  Language: Good  Akathisia:  No  Handed:  Right  AIMS (if indicated):     Assets:  Physical Health Resilience Social Support  Sleep:  Number of Hours: 5.25   Musculoskeletal: Strength & Muscle Tone: within normal limits Gait & Station: normal Patient leans: N/A  Current Medications: Current Facility-Administered Medications  Medication Dose Route Frequency Provider Last Rate Last Dose  . acetaminophen (TYLENOL) tablet 650 mg  650 mg Oral Q6H PRN Kerry Hough, PA-C      . alum & mag hydroxide-simeth (MAALOX/MYLANTA) 200-200-20 MG/5ML suspension 30 mL  30 mL Oral Q4H PRN Kerry Hough, PA-C      . buPROPion (WELLBUTRIN XL) 24 hr tablet 150 mg  150 mg Oral Daily Nanine Means, NP   150 mg at 07/06/13 0856  .  hydrOXYzine (ATARAX/VISTARIL) tablet 25 mg  25 mg Oral Q6H PRN Kerry HoughSpencer E Simon, PA-C      . magnesium hydroxide (MILK OF MAGNESIA) suspension 30 mL  30 mL Oral Daily PRN Kerry HoughSpencer E Simon, PA-C      . potassium chloride (K-DUR,KLOR-CON) CR tablet 10 mEq  10 mEq Oral Daily Nanine MeansJamison Lord, NP   10 mEq at 07/06/13 0856  . traZODone (DESYREL) tablet 50 mg  50 mg Oral QHS,MR X 1 Spencer E Simon, PA-C   50 mg at 07/06/13 2246    Lab Results: No results found  for this or any previous visit (from the past 48 hour(s)).  Physical Findings: AIMS: Facial and Oral Movements Muscles of Facial Expression: None, normal Lips and Perioral Area: None, normal Jaw: None, normal Tongue: None, normal,Extremity Movements Upper (arms, wrists, hands, fingers): None, normal Lower (legs, knees, ankles, toes): None, normal, Trunk Movements Neck, shoulders, hips: None, normal, Overall Severity Severity of abnormal movements (highest score from questions above): None, normal Incapacitation due to abnormal movements: None, normal Patient's awareness of abnormal movements (rate only patient's report): No Awareness, Dental Status Current problems with teeth and/or dentures?: No Does patient usually wear dentures?: No  CIWA:  CIWA-Ar Total: 1 COWS:  COWS Total Score: 2  Treatment Plan Summary: Daily contact with patient to assess and evaluate symptoms and progress in treatment Medication management  Plan:  Review of chart, vital signs, medications, and notes. 1-Individual and group therapy 2-Medication management for depression and anxiety:  Medications reviewed with the patient and medications started: Wellbutrin 150 mg depression started Potassium 10 mEq daily for hypokalemia Trazodone 50 mg at bedtime for sleep Reviewed medications, denies SE 3-Coping skills for depression, anxiety, and grief Discussed coping strategies.  He has always loved to write poetry and he is encouraged creative writing as an outlet for his feelings.  He is given an exercise today 4-Continue crisis stabilization and management 5-Address health issues--monitoring vital signs, stable 6-Treatment plan in progress to prevent relapse of depression, grief, and anxiety  Medical Decision Making Problem Points:  Established problem, stable/improving (1) and Review of psycho-social stressors (1) Data Points:  Review of new medications or change in dosage (2)  I certify that inpatient  services furnished can reasonably be expected to improve the patient's condition.   Lorinda CreedLARACH, MARY, PMH-NP 07/07/2013, 8:14 AM   Patient seen, evaluated and I agree with notes by Nurse Practitioner. Thedore MinsMojeed Daryn Hicks, MD

## 2013-07-08 MED ORDER — TRAZODONE HCL 50 MG PO TABS: 50.0000 mg | ORAL_TABLET | Freq: Every evening | ORAL | Status: AC | PRN

## 2013-07-08 MED ORDER — BUPROPION HCL ER (XL) 150 MG PO TB24: 150.0000 mg | ORAL_TABLET | Freq: Every day | ORAL | Status: AC

## 2013-07-08 NOTE — Discharge Summary (Signed)
Patient examined and I agree with the assessment and plan  Violeta GelinasBurke Germain Koopmann, MD, MPH, FACS Trauma: [phone] General Surgery: [phone]  07/08/2013 2:46 PM
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
## 2013-07-08 NOTE — [facility] Group Notes (Signed)
[facility] LCSW Aftercare Discharge Planning Group Note   07/08/2013 9:51 AM    Participation Quality:  Appropraite  Mood/Affect:  Appropriate  Depression Rating:  1  Anxiety Rating:  1  Thoughts of Suicide:  No  Will you contract for safety?   NA  Current AVH:  No  Plan for Discharge/Comments:  Patient attended discharge planning group and actively participated in group.  He [facility]  CSW provided all participants with daily workbook.   Transportation Means: Patient has transportation.   Supports:  Patient has a support system.   Christopher [name], Christopher [name]

## 2013-07-08 NOTE — Tx Team (Signed)
Interdisciplinary Treatment Plan Update   Date Reviewed:  07/08/2013  Time Reviewed:  9:46 AM  Progress in Treatment:   Attending groups: Yes Participating in groups: Yes Taking medication as prescribed: Yes  Tolerating medication: Yes Family/Significant other contact made:  Yes, collateral contact with fiance. Patient understands diagnosis: Yes  Discussing patient identified problems/goals with staff: Yes Medical problems stabilized or resolved: Yes Denies suicidal/homicidal ideation: Yes Patient has not harmed self or others: Yes  For review of initial/current patient goals, please see plan of care.  Estimated Length of Stay: Discharge today  Reasons for Continued Hospitalization:  Anxiety Depression Medication stabilization   New Problems/Goals identified:    Discharge Plan or Barriers:   Home with outpatient [facility]  Additional Comments:   Attendees:  Patient:  Christopher [name] 07/08/2013 9:46 AM   Signature: Mervyn GayJ. Jonnalagadda, MD 07/08/2013 9:46 AM  Signature:  Claudette Headonrad Withrow, FNP 07/08/2013 9:46 AM  Signature:  Quintella ReichertBeverly Knight, RN  07/08/2013 9:46 AM  Signature:  Neill Loftarol Davis, RN 07/08/2013 9:46 AM  Signature:  Aloha GellKrista Dopson, RN 07/08/2013 9:46 AM  Signature:  Juline PatchQuylle Jeweline Reif, LCSW 07/08/2013 9:46 AM  Signature:  Reyes Ivanhelsea Horton, LCSW 07/08/2013 9:46 AM  Signature:  07/08/2013 9:46 AM  Signature:     07/08/2013 9:46 AM  Signature:    07/08/2013  9:46 AM  Signature:   Onnie BoerJennifer Clark, RN [facility] 07/08/2013  9:46 AM  Signature:   07/08/2013  9:46 AM    Scribe for Treatment Team:   Juline PatchQuylle Marykate Heuberger,  07/08/2013 9:46 AM

## 2013-07-08 NOTE — BHH Suicide Risk Assessment (Signed)
   Demographic Factors:  Male and Adolescent or young adult  Total Time spent with patient: 30 minutes  Psychiatric Specialty Exam: Physical Exam  ROS  Blood pressure 110/69, pulse 98, temperature 97.6 F (36.4 C), temperature source Oral, resp. rate 20, height 5\' 7"  (1.702 m), weight 79.833 kg (176 lb).Body mass index is 27.56 kg/(m^2).  General Appearance: Casual  Eye Contact::  Good  Speech:  Clear and Coherent  Volume:  Normal  Mood:  Euthymic  Affect:  Appropriate and Congruent  Thought Process:  Goal Directed and Intact  Orientation:  Full (Time, Place, and Person)  Thought Content:  WDL  Suicidal Thoughts:  No  Homicidal Thoughts:  No  Memory:  Immediate;   Fair  Judgement:  Intact  Insight:  Good  Psychomotor Activity:  Normal  Concentration:  Good  Recall:  Good  Fund of Knowledge:Good  Language: Good  Akathisia:  NA  Handed:  Right  AIMS (if indicated):     Assets:  Communication Skills Desire for Improvement Financial Resources/Insurance Housing Leisure Time Physical Health Resilience Social Support Talents/Skills Transportation Vocational/Educational  Sleep:  Number of Hours: 5    Musculoskeletal: Strength & Muscle Tone: within normal limits Gait & Station: normal Patient leans: N/A   Mental Status Per Nursing Assessment::   On Admission:     Current Mental Status by Physician: Patient has a good mood with appropriate bright and full affect. Patient has no suicidal, homicidal ideation, intention or plans.  Loss Factors: Financial problems/change in socioeconomic status  Historical Factors: Impulsivity  Risk Reduction Factors:   Sense of responsibility to family, Religious beliefs about death, Employed, Living with another person, especially a relative, Positive social support, Positive therapeutic relationship and Positive coping skills or problem solving skills  Continued Clinical Symptoms:  Depression:   Impulsivity Recent sense of  peace/wellbeing  Cognitive Features That Contribute To Risk:  Polarized thinking    Suicide Risk:  Minimal: No identifiable suicidal ideation.  Patients presenting with no risk factors but with morbid ruminations; may be classified as minimal risk based on the severity of the depressive symptoms  Discharge Diagnoses:   AXIS I:  Major Depression, Recurrent severe AXIS II:  Deferred AXIS III:  History reviewed. No pertinent past medical history. AXIS IV:  other psychosocial or environmental problems, problems related to social environment and problems with primary support group AXIS V:  61-70 mild symptoms  Plan Of Care/Follow-up recommendations:  Activity:  As tolerated Diet:  Regular  Is patient on multiple antipsychotic therapies at discharge:  No   Has Patient had three or more failed trials of antipsychotic monotherapy by history:  No  Recommended Plan for Multiple Antipsychotic Therapies: NA    Sheriann Newmann,[name] 07/08/2013, 1:14 PM

## 2013-07-08 NOTE — Progress Notes (Signed)
Discharge Note:  Patient discharged home.  Patient denied SI and HI.   Denied A/V hallucinations.  Patient stated he received all his belongings, clothing, miscellaneous items, toiletries, medications, prescriptions.  Suicide prevention given and discussed with patient who stated he understood and had no questions.  Patient stated he appreciated all assistance received from [facility] staff.

## 2013-07-08 NOTE — Progress Notes (Signed)
[facility] :  Will you be returning to the same living situation after discharge: Yes,  Patient will be returning to his home. At discharge, do you have transportation home?:Yes,  Patient to arrange his own transportation home. Do you have the ability to pay for your medications:No.     Release of information consent forms completed and in the chart;  Patient's signature needed at discharge.  Patient to Follow up at: Follow-up Information   Follow up with [facility] On 07/09/2013. (Please to [facility]'s walk in clinic on Tuesday, July 09, 2013 or any weekday between 8AM - 3PM for medication managment.)    Contact information:   [address] [place], [place]  [address]  [phone]      [facility] On 07/11/2013. (You are scheduled with [facility] on Thursday, July 11, 2013 at 10 AM)    Contact information:   [address] [place], [place]   [address]  [phone]      Patient denies SI/HI:   Patient no longer endorsing SI/HI or other thoughts of self harm.   Safety Planning and Suicide Prevention discussed:  .Reviewed with all patients during discharge planning group  Levoy Geisen, Joesph JulyQuylle Hairston 07/08/2013, 9:52 AM
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
## 2013-07-08 NOTE — Progress Notes (Signed)
D:  Patient's self inventory sheet, patient sleeps well, good appetite, normal energy level, good attention span.  Rate depression, hopeless and anxiety #1.  Denied withdrawals.  Denied SI.  Plans to get out with positive people.  Does have discharge plans.  Not sure about purchasing medications after discharge. A:  Medications administered per MD orders.  Emotional support and encouragement given patient. R:  Denied SI and HI.  Denied A/V hallucinations.  Will continue to monitor patient with 15 minute checks.  Safety maintained.

## 2013-07-15 NOTE — Progress Notes (Signed)
Patient Discharge Instructions:  After Visit Summary (AVS):   Faxed to:  07/15/13 Discharge Summary Note:   Faxed to:  07/15/13 Psychiatric Admission Assessment Note:   Faxed to:  07/15/13 Suicide Risk Assessment - Discharge Assessment:   Faxed to:  07/15/13 Faxed/Sent to the Next [facility] Care provider:  07/15/13 Faxed to [facility] @ [phone] [facility]ax[facility] to [facility] @ [phone]  Jerelene ReddenSheena E [place], 07/15/2013, 3:13 PM
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
